# Patient Record
Sex: Female | Born: 1995 | Race: Black or African American | Hispanic: No | Marital: Single | Smoking: Never smoker
Health system: Southern US, Community
[De-identification: ages and names within clinical notes are randomized; demographics above are authoritative.]

## PROBLEM LIST (undated history)

## (undated) DIAGNOSIS — J45909 Unspecified asthma, uncomplicated: Secondary | ICD-10-CM

## (undated) DIAGNOSIS — J309 Allergic rhinitis, unspecified: Secondary | ICD-10-CM

## (undated) HISTORY — PX: TYMPANOSTOMY TUBE PLACEMENT: SHX32

## (undated) HISTORY — PX: ADENOIDECTOMY: SUR15

## (undated) HISTORY — DX: Allergic rhinitis, unspecified: J30.9

## (undated) HISTORY — PX: TONSILLECTOMY: SUR1361

## (undated) HISTORY — DX: Unspecified asthma, uncomplicated: J45.909

---
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
## 2018-01-17 ENCOUNTER — Encounter: Payer: Self-pay | Admitting: Pediatrics

## 2018-01-17 ENCOUNTER — Ambulatory Visit (INDEPENDENT_AMBULATORY_CARE_PROVIDER_SITE_OTHER): Payer: Medicaid Other | Admitting: Pediatrics

## 2018-01-17 VITALS — BP 110/76 | HR 98 | Temp 97.7°F | Resp 18 | Ht 64.0 in | Wt 177.8 lb

## 2018-01-17 DIAGNOSIS — J4530 Mild persistent asthma, uncomplicated: Secondary | ICD-10-CM | POA: Diagnosis not present

## 2018-01-17 DIAGNOSIS — T7800XD Anaphylactic reaction due to unspecified food, subsequent encounter: Secondary | ICD-10-CM

## 2018-01-17 DIAGNOSIS — T7800XA Anaphylactic reaction due to unspecified food, initial encounter: Secondary | ICD-10-CM | POA: Insufficient documentation

## 2018-01-17 NOTE — Patient Instructions (Signed)
Stop loratadine Continue on your other medications Return in 2 days for allergy testing

## 2018-01-17 NOTE — Progress Notes (Signed)
[address] [name] [address] Dept: [phone]  New Patient Note  Patient ID: Ashley Rowland, female    DOB: April 13, 1995  Age: 23 y.o. MRN: [id number] Date of Office Visit: 01/17/2018 Referring provider: Shellia Carwin, MD [address] [place], [place] [address]    Chief Complaint: Asthma and Allergic Reaction (peanut allergy causes asthma attack.)  HPI Ashley Rowland presents for an allergy evaluation.  She has had asthma since early infancy and  takes montelukast 10 mg once a day and Pro-air as needed.  If she has a flareup of asthma she has Flovent.  She has had nasal congestion off and on for several years.  In the past she has had some throat swelling and shortness of breath from peanuts.  She avoids peanuts and tree nuts She has flareups of her r asthma on exposure to weather changes.  She has not had pneumonia, eczema or recurrent sinus infections  Review of Systems  Constitutional: Negative.   HENT:       Nasal congestion off and on for several years  Eyes: Negative.   Respiratory:       Asthma since early childhood  Cardiovascular: Negative.   Gastrointestinal: Negative.   Genitourinary: Negative.   Musculoskeletal: Negative.   Skin:       Hives from peanuts  Neurological: Negative.   Endo/Heme/Allergies:       No diabetes or thyroid disease  Psychiatric/Behavioral: Negative.     Outpatient Encounter Medications as of 01/17/2018  Medication Sig  . albuterol (PROVENTIL HFA) 108 (90 Base) MCG/ACT inhaler Inhale 2 puffs into the lungs every 4 (four) hours as needed for cough.  . EPINEPHrine (EPIPEN 2-PAK) 0.3 mg/0.3 mL IJ SOAJ injection 0.3 mg. Use as directed for severe allergic reaction  . fluticasone (FLOVENT HFA) 44 MCG/ACT inhaler Inhale 2 puffs into the lungs daily as needed (if cough or wheeze).  Marland Kitchen ketoconazole (NIZORAL) 2 % shampoo Apply 1 application topically 2 (two) times a week.  . loratadine (CLARITIN) 10 MG tablet Take 10 mg by mouth  daily.  . montelukast (SINGULAIR) 10 MG tablet Take 10 mg by mouth at bedtime.  Marland Kitchen omeprazole (PRILOSEC) 20 MG capsule Take 20 mg by mouth daily.   No facility-administered encounter medications on file as of 01/17/2018.      Drug Allergies:  No Known Allergies  Family History: Ashley's family history includes Allergic rhinitis in her paternal grandmother; Asthma in her maternal grandmother, paternal grandfather, and paternal grandmother..  Family history is positive for sinus problems and food allergies.  Family history is negative for angioedema ,eczema , chronic urticaria, lupus, chronic bronchitis or emphysema.  Social and environmental.  There is a dog in the home.  She is exposed to cigarette smoking at home.  She has not smoked cigarettes in the past.  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 She is normal weight.  HENT:     Head:     Comments: Eyes normal.  Ears normal.  Nose normal.  Pharynx normal. Neck:     Musculoskeletal: Neck  supple.  Cardiovascular:     Comments: S1-S2 normal no murmurs Pulmonary:     Comments: Clear to percussion and auscultation Abdominal:     Palpations: Abdomen is soft.     Tenderness: There is no abdominal tenderness.     Comments: No hepatosplenomegaly  Lymphadenopathy:     Cervical: No cervical adenopathy.  Skin:    Comments: Clear  Neurological:     General: No focal deficit present.     Mental Status: She is alert and oriented to person, place, and time.  Psychiatric:        Mood and Affect: Mood normal.        Behavior: Behavior normal.        Thought Content: Thought content normal.        Judgment: Judgment normal.      Diagnostics: FVC 3.04 L FEV1 2.26 L.  Predicted FVC 3.31 L predicted FEV1 2.93 L.  After albuterol 2 puffs FVC 3.00 L FEV1 2.43 L- this shows a mild reduction in the FEV1 but no significant improvement after albuterol   Assessment  Assessment and Plan: 1. Mild persistent asthma without complication   2. Anaphylactic shock due to food, subsequent encounter     r.   Patient Instructions  Stop loratadine Continue on your other medications Return in 2 days for allergy testing   Return in about 2 days (around 01/19/2018).   Thank you for the opportunity to care for this patient.  Please do not hesitate to contact me with questions.  [name], M.D.  [facility] [address] [place], [place] [address] [phone]

## 2018-01-19 ENCOUNTER — Encounter: Payer: Self-pay | Admitting: Allergy

## 2018-01-19 ENCOUNTER — Ambulatory Visit (INDEPENDENT_AMBULATORY_CARE_PROVIDER_SITE_OTHER): Payer: Medicaid Other | Admitting: Allergy

## 2018-01-19 VITALS — BP 118/72 | HR 97 | Resp 17

## 2018-01-19 DIAGNOSIS — T7800XD Anaphylactic reaction due to unspecified food, subsequent encounter: Secondary | ICD-10-CM | POA: Diagnosis not present

## 2018-01-19 DIAGNOSIS — J4530 Mild persistent asthma, uncomplicated: Secondary | ICD-10-CM | POA: Diagnosis not present

## 2018-01-19 NOTE — Progress Notes (Signed)
Follow-up Note  RE: Ashley Rowland MRN: [id number] DOB: 1995-12-01 Date of Office Visit: 01/19/2018   History of present illness: Ashley Rowland is a 23 y.o. female presenting today for skin testing.  She was last seen 2 days ago for initial visit with Dr. Beaulah Dinning for asthma.  She reported at this visit that peanut ingestion causes her to wheeze and feel SOB.  She also avoids tree nuts. She also reported her asthma flares with weather changes.   She has access to an epipen.   She stopped her loratadine for testing today.    Review of systems: Review of Systems  Constitutional: Negative for chills, fever and malaise/fatigue.  HENT: Negative for congestion, nosebleeds and sore throat.   Eyes: Negative for discharge and redness.  Respiratory: Negative for cough, shortness of breath and wheezing.   Cardiovascular: Negative for chest pain.  Gastrointestinal: Negative for abdominal pain, constipation, diarrhea, nausea and vomiting.  Musculoskeletal: Negative for joint pain.  Skin: Negative for itching and rash.  Neurological: Negative for headaches.    All other systems negative unless noted above in HPI  Past medical/social/surgical/family history have been reviewed and are unchanged unless specifically indicated below.  No changes  Medication List: Allergies as of 01/19/2018      Reactions   Peanut Oil Other (See Comments)      Medication List       Accurate as of January 19, 2018 11:41 AM. Always use your most recent med list.        beclomethasone 40 MCG/ACT inhaler Commonly known as:  QVAR Inhale into the lungs.   cyclobenzaprine 10 MG tablet Commonly known as:  FLEXERIL Take by mouth.   EPIPEN 2-PAK 0.3 mg/0.3 mL Soaj injection Generic drug:  EPINEPHrine 0.3 mg. Use as directed for severe allergic reaction   fluticasone 44 MCG/ACT inhaler Commonly known as:  FLOVENT HFA Inhale 2 puffs into the lungs daily as needed (if cough or wheeze).     ketoconazole 2 % shampoo Commonly known as:  NIZORAL Apply 1 application topically 2 (two) times a week.   loratadine 10 MG tablet Commonly known as:  CLARITIN Take 10 mg by mouth daily.   montelukast 10 MG tablet Commonly known as:  SINGULAIR Take 10 mg by mouth at bedtime.   naproxen 500 MG tablet Commonly known as:  NAPROSYN Take by mouth.   omeprazole 20 MG capsule Commonly known as:  PRILOSEC Take 20 mg by mouth daily.   PROVENTIL HFA 108 (90 Base) MCG/ACT inhaler Generic drug:  albuterol Inhale 2 puffs into the lungs every 4 (four) hours as needed for cough.       Known medication allergies: Allergies  Allergen Reactions  . Peanut Oil Other (See Comments)     Physical examination: Blood pressure 118/72, pulse 97, resp. rate 17, SpO2 100 %.  General: Alert, interactive, in no acute distress. HEENT: PERRLA, TMs pearly gray, turbinates non-edematous without discharge, post-pharynx non erythematous. Neck: Supple without lymphadenopathy. Lungs: Clear to auscultation without wheezing, rhonchi or rales. {no increased work of breathing. CV: Normal S1, S2 without murmurs. Abdomen: Nondistended, nontender. Skin: Warm and dry, without lesions or rashes. Extremities:  No clubbing, cyanosis or edema. Neuro:   Grossly intact.  Diagnositics/Labs:  Allergy testing: environmental allergy skin prick testing is positive to weeds, trees and grass pollen and curvularia mold.   Intradermal testing is positive to cockroach and mite mix.   Select food allergy skin prick testing is positive  to almond and coconut.   Allergy testing results were read and interpreted by provider, documented by clinical staff.   Assessment and plan:   Anaphylaxis due to food - select food allergy skin prick testing to peanut and tree nuts is positive to almond and coconut.  Will obtain serum IgE levels for peanut (with components) and tree nuts.   - continue avoidance of peanut and tree nuts at  this time.   - have access to self-injectable epinephrine Epipen 0.3mg  at all times - follow emergency action plan in case of allergic reaction provided at initial visit  Mild persistent asthma, allergen triggered - environmental allergy skin prick testing is positive to grass pollens, tree pollens, weed pollens, mold, dust mites and cockroach.  Allergen avoidance measures discussed/handouts provided - can resume use of Loratadine 10mg  daily as needed - continue daily use of montelukast 10mg  at bedtime - have access to albuterol inhaler 2 puffs every 4-6 hours as needed for cough/wheeze/shortness of breath/chest tightness.  May use 15-20 minutes prior to activity.   Monitor frequency of use.   - can use either your Qvar or Flovent (they are similar medications) 2 puffs twice a day during asthma flares.  If you are not meeting the below goals then start either your Qvar or Flovent 2 puffs twice a day every day as maintenance medication.    Follow-up 4-6 months or sooner if needed   I appreciate the opportunity to take part in Ashley's care. Please do not hesitate to contact me with questions.  Sincerely,   Margo AyeShaylar Padgett, MD Allergy/Immunology [facility]

## 2018-01-19 NOTE — Patient Instructions (Addendum)
-   select food allergy skin prick testing to peanut and tree nuts is positive to almond and coconut.  Will obtain serum IgE levels for peanut (with components) and tree nuts.   - continue avoidance of peanut and tree nuts at this time.   - have access to self-injectable epinephrine Epipen 0.3mg  at all times - follow emergency action plan in case of allergic reaction provided at initial visit  - environmental allergy skin prick testing is positive to grass pollens, tree pollens, weed pollens, mold, dust mites and cockroach.  Allergen avoidance measures discussed/handouts provided - can resume use of Loratadine 10mg  daily as needed - continue daily use of montelukast 10mg  at bedtime - have access to albuterol inhaler 2 puffs every 4-6 hours as needed for cough/wheeze/shortness of breath/chest tightness.  May use 15-20 minutes prior to activity.   Monitor frequency of use.   - can use either your Qvar or Flovent (they are similar medications) 2 puffs twice a day during asthma flares.  If you are not meeting the below goals then start either your Qvar or Flovent 2 puffs twice a day every day as maintenance medication.    Follow-up 4-6 months or sooner if needed

## 2020-03-13 ENCOUNTER — Encounter (HOSPITAL_COMMUNITY): Payer: Self-pay | Admitting: Emergency Medicine

## 2020-03-13 ENCOUNTER — Other Ambulatory Visit: Payer: Self-pay

## 2020-03-13 ENCOUNTER — Emergency Department (HOSPITAL_COMMUNITY)
Admission: EM | Admit: 2020-03-13 | Discharge: 2020-03-14 | Disposition: A | Discharge status: Home / Self Care | Payer: Medicaid Other | Attending: Emergency Medicine | Admitting: Emergency Medicine

## 2020-03-13 DIAGNOSIS — R079 Chest pain, unspecified: Secondary | ICD-10-CM | POA: Insufficient documentation

## 2020-03-13 DIAGNOSIS — Z5321 Procedure and treatment not carried out due to patient leaving prior to being seen by health care provider: Secondary | ICD-10-CM | POA: Insufficient documentation

## 2020-03-13 DIAGNOSIS — M542 Cervicalgia: Secondary | ICD-10-CM | POA: Diagnosis not present

## 2020-03-13 DIAGNOSIS — W010XXA Fall on same level from slipping, tripping and stumbling without subsequent striking against object, initial encounter: Secondary | ICD-10-CM | POA: Diagnosis not present

## 2020-03-13 DIAGNOSIS — Y9301 Activity, walking, marching and hiking: Secondary | ICD-10-CM | POA: Insufficient documentation

## 2020-03-13 DIAGNOSIS — R519 Headache, unspecified: Secondary | ICD-10-CM | POA: Insufficient documentation

## 2020-03-13 NOTE — ED Triage Notes (Signed)
Patient tripped and fell forward while walking her dog this afternoon with brief LOC , reports headache , posterior neck and upper chest pain , alert and oriented /respirations unlabored/ambulatory  .

## 2020-03-14 NOTE — ED Notes (Signed)
Pt did not answer when called for treatment area and is not seen in or outside of hospital lobby

## 2020-03-14 NOTE — ED Notes (Signed)
Pt did not respond when called for vitals check 

## 2020-05-05 ENCOUNTER — Emergency Department (HOSPITAL_COMMUNITY)
Admission: EM | Admit: 2020-05-05 | Discharge: 2020-05-06 | Discharge status: Against Medical Advice | Payer: Medicaid Other | Attending: Emergency Medicine | Admitting: Emergency Medicine

## 2020-05-05 ENCOUNTER — Other Ambulatory Visit: Payer: Self-pay

## 2020-05-05 ENCOUNTER — Emergency Department (HOSPITAL_COMMUNITY): Payer: Medicaid Other

## 2020-05-05 ENCOUNTER — Encounter (HOSPITAL_COMMUNITY): Payer: Self-pay | Admitting: Emergency Medicine

## 2020-05-05 DIAGNOSIS — R590 Localized enlarged lymph nodes: Secondary | ICD-10-CM | POA: Insufficient documentation

## 2020-05-05 DIAGNOSIS — Z20822 Contact with and (suspected) exposure to covid-19: Secondary | ICD-10-CM | POA: Insufficient documentation

## 2020-05-05 DIAGNOSIS — R0789 Other chest pain: Secondary | ICD-10-CM | POA: Diagnosis not present

## 2020-05-05 DIAGNOSIS — Z5321 Procedure and treatment not carried out due to patient leaving prior to being seen by health care provider: Secondary | ICD-10-CM | POA: Insufficient documentation

## 2020-05-05 DIAGNOSIS — J45901 Unspecified asthma with (acute) exacerbation: Secondary | ICD-10-CM | POA: Insufficient documentation

## 2020-05-05 DIAGNOSIS — B349 Viral infection, unspecified: Secondary | ICD-10-CM | POA: Diagnosis not present

## 2020-05-05 DIAGNOSIS — E669 Obesity, unspecified: Secondary | ICD-10-CM | POA: Diagnosis not present

## 2020-05-05 DIAGNOSIS — R0602 Shortness of breath: Secondary | ICD-10-CM | POA: Diagnosis present

## 2020-05-05 DIAGNOSIS — J45909 Unspecified asthma, uncomplicated: Secondary | ICD-10-CM | POA: Insufficient documentation

## 2020-05-05 LAB — RESP PANEL BY RT-PCR (FLU A&B, COVID) ARPGX2
Influenza A by PCR: NEGATIVE
Influenza B by PCR: NEGATIVE
SARS Coronavirus 2 by RT PCR: NEGATIVE

## 2020-05-05 NOTE — ED Triage Notes (Signed)
Emergency Medicine Provider Triage Evaluation Note  Ashley Rowland , a 25 y.o. female  was evaluated in triage.  Pt complains of myalgias, chest tightness, sore throat x3 days. She notes she was exposed to COVID. She has been vaccinated against COVID-19. History of asthma. She notes she has been wheezing for the past few days.   Review of Systems  Positive: Shortness of breath, chest tightness Negative: fever  Physical Exam  There were no vitals taken for this visit. Gen:   Awake, no distress   HEENT:  Atraumatic  Resp:  Normal effort  Cardiac:  Normal rate  Abd:   Nondistended, nontender  MSK:   Moves extremities without difficulty  Neuro:  Speech clear  Medical Decision Making  Medically screening exam initiated at 6:56 PM.  Appropriate orders placed.  Ashley Rowland was informed that the remainder of the evaluation will be completed by another provider, this initial triage assessment does not replace that evaluation, and the importance of remaining in the ED until their evaluation is complete.  Clinical Impression  COVID exposure. SOB and chest tightness with history of asthma. Suspect viral etiology. COVID test ordered. CXR and EKG.   Mannie Stabile, [place] 05/05/20 1901

## 2020-05-05 NOTE — ED Triage Notes (Signed)
Pt. 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 My chest is tight and Im SOB but I have  Asthma.  This all started 3 days ago.

## 2020-05-05 NOTE — ED Notes (Signed)
Pt left due to not being seen quick enough 

## 2020-05-06 ENCOUNTER — Emergency Department (HOSPITAL_COMMUNITY)
Admission: EM | Admit: 2020-05-06 | Discharge: 2020-05-06 | Disposition: A | Discharge status: Home / Self Care | Payer: Medicaid Other | Source: Home / Self Care | Attending: Emergency Medicine | Admitting: Emergency Medicine

## 2020-05-06 ENCOUNTER — Other Ambulatory Visit: Payer: Self-pay

## 2020-05-06 DIAGNOSIS — Z20822 Contact with and (suspected) exposure to covid-19: Secondary | ICD-10-CM | POA: Insufficient documentation

## 2020-05-06 DIAGNOSIS — B349 Viral infection, unspecified: Secondary | ICD-10-CM | POA: Insufficient documentation

## 2020-05-06 DIAGNOSIS — Z7951 Long term (current) use of inhaled steroids: Secondary | ICD-10-CM | POA: Insufficient documentation

## 2020-05-06 DIAGNOSIS — J45901 Unspecified asthma with (acute) exacerbation: Secondary | ICD-10-CM | POA: Insufficient documentation

## 2020-05-06 DIAGNOSIS — R590 Localized enlarged lymph nodes: Secondary | ICD-10-CM | POA: Insufficient documentation

## 2020-05-06 DIAGNOSIS — E669 Obesity, unspecified: Secondary | ICD-10-CM | POA: Insufficient documentation

## 2020-05-06 MED ORDER — IPRATROPIUM-ALBUTEROL 20-100 MCG/ACT IN AERS
4.0000 | INHALATION_SPRAY | Freq: Four times a day (QID) | RESPIRATORY_TRACT | Status: DC
  Administered 2020-05-06: 4 via RESPIRATORY_TRACT
  Filled 2020-05-06: qty 4

## 2020-05-06 MED ORDER — METHYLPREDNISOLONE SODIUM SUCC 125 MG IJ SOLR
125.0000 mg | Freq: Once | INTRAMUSCULAR | Status: AC
  Administered 2020-05-06: 125 mg via INTRAVENOUS
  Filled 2020-05-06: qty 2

## 2020-05-06 MED ORDER — PREDNISONE 20 MG PO TABS: 40.0000 mg | ORAL_TABLET | Freq: Every day | ORAL | 0 refills | Status: AC

## 2020-05-06 NOTE — ED Triage Notes (Signed)
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 Sore throat, sob, non productive cough, chest tightness started three days ago.

## 2020-05-06 NOTE — ED Notes (Signed)
Pt O2 sat remained at 100% while ambulating.

## 2020-05-06 NOTE — Discharge Instructions (Addendum)
You likely have a viral illness.  This should be treated symptomatically. Take prednisone as prescribed starting tomorrow to help with inflammation. Use your albuterol inhaler every 4 hours while awake for the next 2 days.  After this, use as needed for shortness of breath, chest tightness, wheezing Use Tylenol or ibuprofen as needed for fevers or body aches. Make sure you stay well-hydrated with water. Wash your hands frequently to prevent spread of infection. Follow-up with your primary care doctor in 1 week if your symptoms are not improving. Return to the emergency room if you develop chest pain, difficulty breathing, or any new or worsening symptoms.

## 2020-05-06 NOTE — ED Provider Notes (Signed)
[facility] EMERGENCY DEPARTMENT Provider Note   CSN: [id number] Arrival date & time: 05/06/20  3810     History Chief Complaint  Patient presents with  . Covid Exposure    Ashley Rowland is a 25 y.o. female presenting for evaluation of sore throat, body aches, chest tightness.  Patient states she has had symptoms for the past 4 days.  She reports persistent sore throat, body aches, chest tightness.  She also reports a mild nonproductive cough.  She reports a subjective fever yesterday, no fevers today.  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  She is fully vaccinated for COVID.  She has a history of asthma, used her inhaler yesterday without improvement of symptoms, has not tried it again since.  She denies chest pain, nausea, vomiting, abdominal pain, urinary symptoms, abnormal bowel movements.  Does have asthma, has no other medical problems.  She is not taken anything else for her symptoms.  She is not vaccinated for flu.  HPI     Past Medical History:  Diagnosis Date  . Allergic rhinitis   . Asthma     Patient Active Problem List   Diagnosis Date Noted  . Anaphylactic shock due to adverse food reaction 01/17/2018  . Mild persistent asthma without complication 01/17/2018  . Asymmetrical sensorineural hearing loss 06/24/2011    Past Surgical History:  Procedure Laterality Date  . ADENOIDECTOMY    . TONSILLECTOMY    . TYMPANOSTOMY TUBE PLACEMENT       OB History   No obstetric history on file.     Family History  Problem Relation Age of Onset  . Asthma Maternal Grandmother   . Allergic rhinitis Paternal Grandmother   . Asthma Paternal Grandmother   . Asthma Paternal Grandfather   . Angioedema Neg Hx   . Eczema Neg Hx   . Immunodeficiency Neg Hx   . Urticaria Neg Hx     Social History   Tobacco Use  . Smoking status: Never Smoker  . Smokeless tobacco: Never Used  Vaping Use   . Vaping Use: Never used  Substance Use Topics  . Alcohol use: Yes    Comment: social drinks  . Drug use: Never    Home Medications Prior to Admission medications   Medication Sig Start Date End Date Taking? Authorizing Provider  predniSONE (DELTASONE) 20 MG tablet Take 2 tablets (40 mg total) by mouth daily for 4 days. 05/07/20 05/11/20 Yes Ryden Wainer, PA-C  albuterol (PROVENTIL HFA) 108 (90 Base) MCG/ACT inhaler Inhale 2 puffs into the lungs every 4 (four) hours as needed for cough. 05/31/11   [provider]  beclomethasone (QVAR) 40 MCG/ACT inhaler Inhale into the lungs.    [provider]  EPINEPHrine (EPIPEN 2-PAK) 0.3 mg/0.3 mL IJ SOAJ injection 0.3 mg. Use as directed for severe allergic reaction    [provider]  fluticasone (FLOVENT HFA) 44 MCG/ACT inhaler Inhale 2 puffs into the lungs daily as needed (if cough or wheeze).    [provider]  ketoconazole (NIZORAL) 2 % shampoo Apply 1 application topically 2 (two) times a week.    [provider]  loratadine (CLARITIN) 10 MG tablet Take 10 mg by mouth daily.    [provider]  montelukast (SINGULAIR) 10 MG tablet Take 10 mg by mouth at bedtime.    [provider]  naproxen (NAPROSYN) 500 MG tablet Take by mouth. 01/17/18   [provider]  omeprazole (PRILOSEC) 20 MG capsule Take 20 mg by mouth daily.    [provider]    Allergies    Peanut oil  Review of Systems   Review of Systems  Constitutional: Positive for fever (subjective).  HENT: Positive for sore throat.   Respiratory: Positive for cough, chest tightness and wheezing.   All other systems reviewed and are negative.   Physical Exam Updated Vital Signs BP 124/65 (BP Location: Left Arm)   Pulse 80   Temp 98.4 F (36.9 C) (Oral)   Resp 18   Ht 5\' 4"  (1.626 m)   Wt 95.3 kg   LMP 04/16/2020   SpO2 100%   BMI 36.05 kg/m   Physical Exam Vitals and nursing note reviewed.   Constitutional:      General: She is not in acute distress.    Appearance: She is well-developed. She is obese.     Comments: Resting in the bed in no acute distress  HENT:     Head: Normocephalic and atraumatic.  Eyes:     Extraocular Movements: Extraocular movements intact.     Conjunctiva/sclera: Conjunctivae normal.     Pupils: Pupils are equal, round, and reactive to light.  Cardiovascular:     Rate and Rhythm: Normal rate and regular rhythm.     Pulses: Normal pulses.  Pulmonary:     Effort: Pulmonary effort is normal. No respiratory distress.     Breath sounds: Examination of the right-lower field reveals decreased breath sounds. Examination of the left-lower field reveals decreased breath sounds. Decreased breath sounds present. No wheezing.     Comments: Slightly diminished air movement in bases bilaterally.  No obvious wheezing.  Speaking in full sentences.  Sats stable on room air. Abdominal:     General: There is no distension.     Palpations: Abdomen is soft. There is no mass.     Tenderness: There is no abdominal tenderness. There is no guarding or rebound.  Musculoskeletal:        General: Normal range of motion.     Cervical back: Normal range of motion and neck supple.  Lymphadenopathy:     Cervical: Cervical adenopathy present.  Skin:    General: Skin is warm and dry.     Capillary Refill: Capillary refill takes less than 2 seconds.  Neurological:     Mental Status: She is alert and oriented to person, place, and time.     ED Results / Procedures / Treatments   Labs (all labs ordered are listed, but only abnormal results are displayed) Labs Reviewed  RESP PANEL BY RT-PCR (FLU A&B, COVID) [id number]    EKG None  Radiology DG Chest 1 View  Result Date: 05/05/2020 CLINICAL DATA:  Chest tightness and sore throat for 3 days. COVID exposure. Wheezing. EXAM: CHEST  1 VIEW COMPARISON:  None. FINDINGS: Mild hyperinflation. The heart size and mediastinal contours  are within normal limits. Both lungs are clear. The visualized skeletal structures are unremarkable. IMPRESSION: No active disease. Electronically Signed   By: 05/07/2020 M.D.   On: 05/05/2020 20:34    Procedures Procedures   Medications Ordered in ED Medications  Ipratropium-Albuterol (COMBIVENT) respimat 4 puff (4 puffs Inhalation Given 05/06/20 1203)  methylPREDNISolone sodium succinate (SOLU-MEDROL) 125 mg/2 mL injection 125 mg (125 mg Intravenous Given 05/06/20 1108)    ED Course  I have reviewed the triage vital signs and the nursing notes.  Pertinent labs & imaging results that were  available during my care of the patient were reviewed by me and considered in my medical decision making (see chart for details).    MDM Rules/Calculators/A&P                          Patient presenting for evaluation of several day history of viral symptoms.  On exam, patient appears nontoxic.  Heart pulmonary exam is overall reassuring, though slightly diminished movement in the lower lungs.  Most consistent with asthma, likely triggered by viral illness.  Patient had a COVID and flu test yesterday in our system which was negative.  Consider other viral illness.  She had a chest x-ray yesterday which I independently reviewed and interpreted, no signs of pneumonia, fluid, or other acute pulmonary process.  As such, we will treat symptomatically for viral illness with asthma exacerbation.  Will ambulate with pulse ox.  Patient ambulated without hypoxia.  On reassessment, patient reports she is feeling much better after medication.  Improved air movement on repeat pulmonary exam.  Discussed continued symptomatic treatment.  Will discharge on prednisone for asthma exacerbation.  At this time, patient appears safe for discharge.  Return precautions given.  Patient states she understands and agrees to plan.  Final Clinical Impression(s) / ED Diagnoses Final diagnoses:  Viral illness  Exacerbation of  asthma, unspecified asthma severity, unspecified whether persistent    Rx / DC Orders ED Discharge Orders         Ordered    predniSONE (DELTASONE) 20 MG tablet  Daily        05/06/20 1245           Naphtali Riede, PA-C 05/06/20 1300    Derwood Kaplan, MD 05/07/20 [phone]

## 2022-07-06 IMAGING — DX DG CHEST 1V
1 series · 1 of 1 positions shown · non-contrast
Comparison: None.

CLINICAL DATA: Chest tightness and sore throat for 3 days. COVID
exposure. Wheezing.

EXAM:
CHEST  1 VIEW

[chest ap]
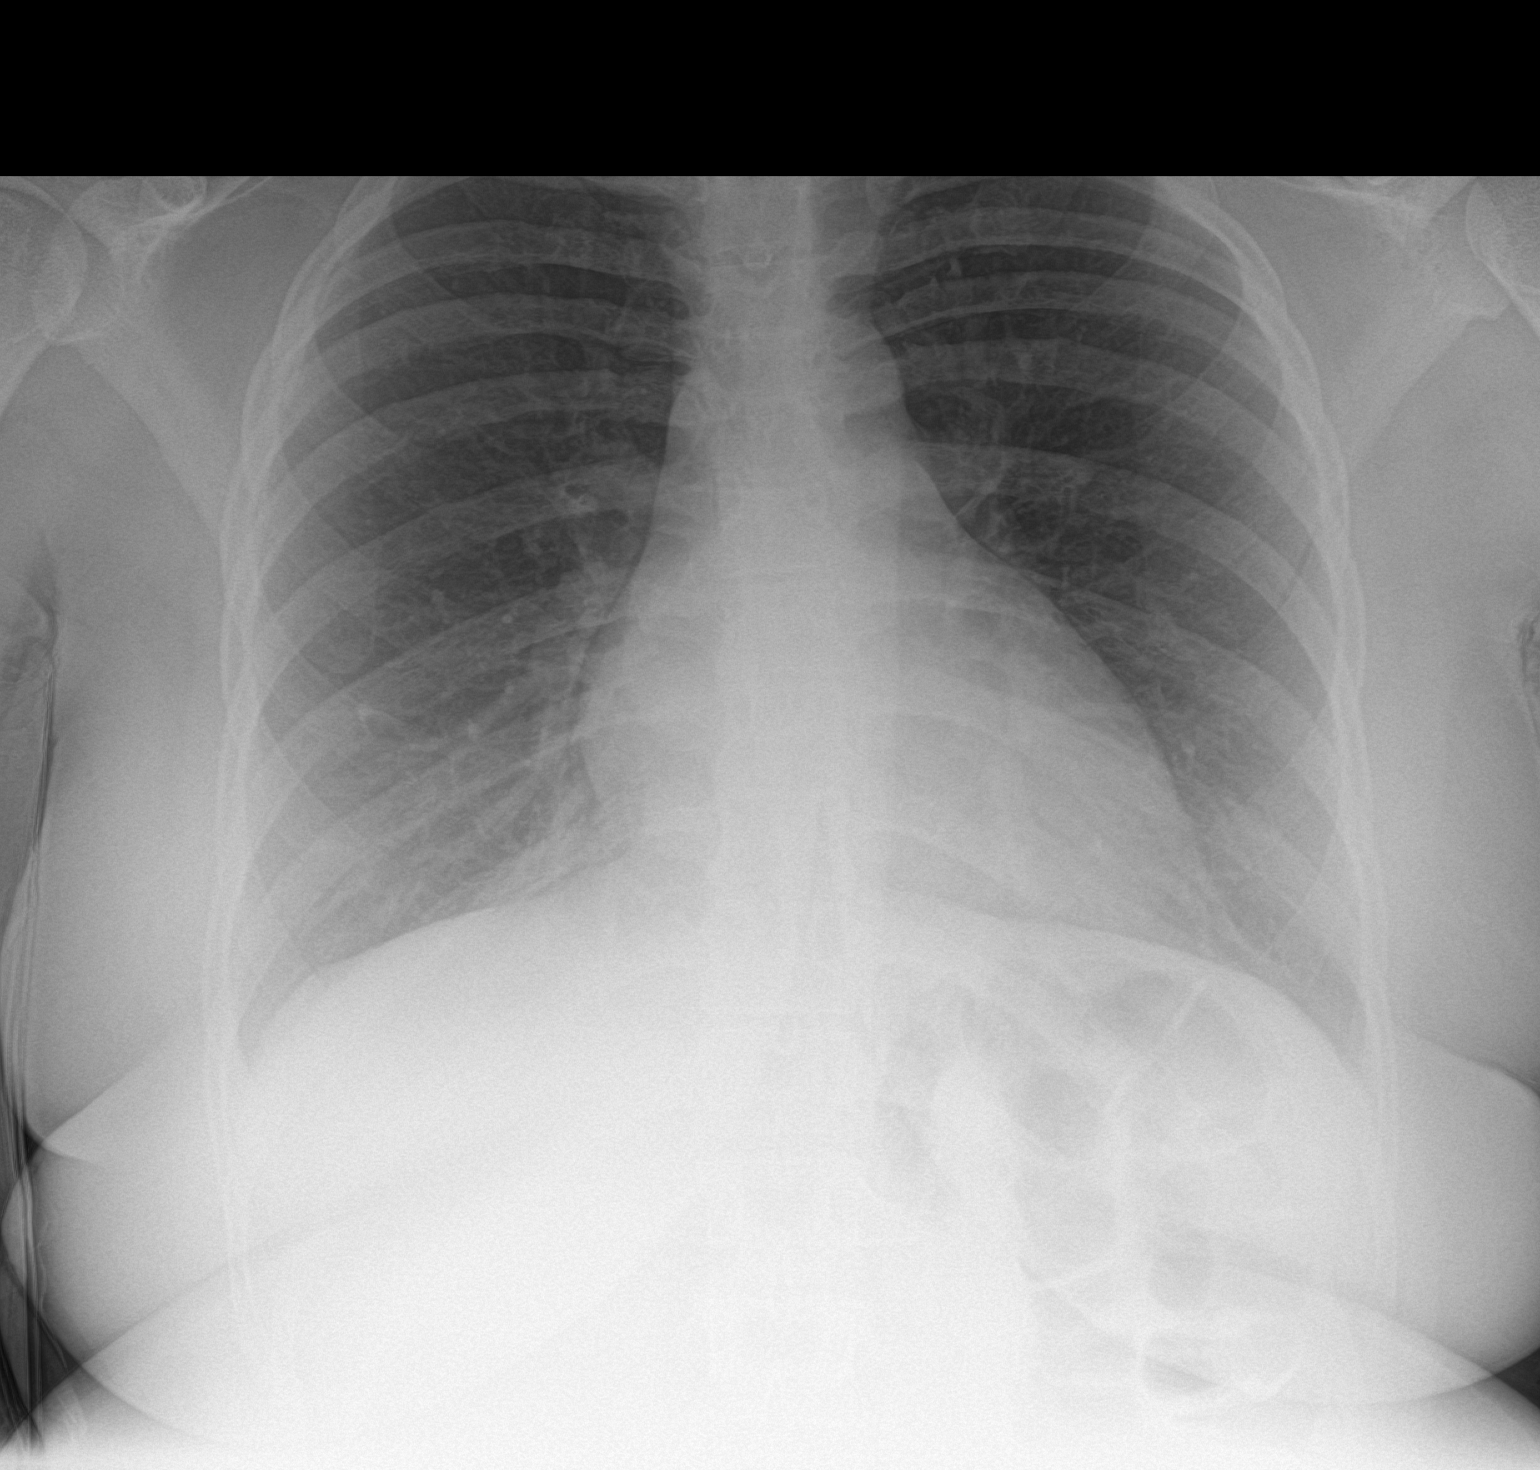

[1 of 1 positions shown; findings below may reference images not displayed]

FINDINGS: Mild hyperinflation. The heart size and mediastinal contours are
within normal limits. Both lungs are clear. The visualized skeletal
structures are unremarkable.
IMPRESSION: No active disease.
